# Patient Record
Sex: Female | Born: 1965 | Race: White | Hispanic: No | Marital: Single
Health system: Southern US, Community
[De-identification: ages and names within clinical notes are randomized; demographics above are authoritative.]

---

## 2006-10-28 ENCOUNTER — Ambulatory Visit: Payer: Self-pay | Admitting: Family Medicine

## 2006-12-16 ENCOUNTER — Ambulatory Visit: Payer: Self-pay | Admitting: Family Medicine

## 2007-12-22 ENCOUNTER — Other Ambulatory Visit: Admission: RE | Admit: 2007-12-22 | Discharge: 2007-12-22 | Payer: Self-pay | Admitting: Family Medicine

## 2008-01-13 ENCOUNTER — Encounter: Admission: RE | Admit: 2008-01-13 | Discharge: 2008-01-13 | Payer: Self-pay | Admitting: Family Medicine

## 2008-11-03 ENCOUNTER — Emergency Department (HOSPITAL_COMMUNITY): Admission: EM | Admit: 2008-11-03 | Discharge: 2008-11-03 | Payer: Self-pay | Admitting: Emergency Medicine

## 2009-03-01 ENCOUNTER — Encounter: Admission: RE | Admit: 2009-03-01 | Discharge: 2009-03-01 | Payer: Self-pay | Admitting: Internal Medicine

## 2010-10-19 ENCOUNTER — Other Ambulatory Visit: Payer: Self-pay | Admitting: Internal Medicine

## 2010-10-19 DIAGNOSIS — Z1231 Encounter for screening mammogram for malignant neoplasm of breast: Secondary | ICD-10-CM

## 2010-11-01 ENCOUNTER — Ambulatory Visit
Admission: RE | Admit: 2010-11-01 | Discharge: 2010-11-01 | Disposition: A | Discharge status: Home / Self Care | Payer: BC Managed Care – PPO | Source: Ambulatory Visit | Attending: Internal Medicine | Admitting: Internal Medicine

## 2010-11-01 DIAGNOSIS — Z1231 Encounter for screening mammogram for malignant neoplasm of breast: Secondary | ICD-10-CM

## 2012-07-17 ENCOUNTER — Other Ambulatory Visit: Payer: Self-pay | Admitting: Internal Medicine

## 2012-07-17 DIAGNOSIS — Z1231 Encounter for screening mammogram for malignant neoplasm of breast: Secondary | ICD-10-CM

## 2012-07-21 ENCOUNTER — Ambulatory Visit
Admission: RE | Admit: 2012-07-21 | Discharge: 2012-07-21 | Disposition: A | Discharge status: Home / Self Care | Payer: BC Managed Care – PPO | Source: Ambulatory Visit | Attending: Internal Medicine | Admitting: Internal Medicine

## 2012-07-21 DIAGNOSIS — Z1231 Encounter for screening mammogram for malignant neoplasm of breast: Secondary | ICD-10-CM

## 2015-12-22 ENCOUNTER — Other Ambulatory Visit: Payer: Self-pay

## 2015-12-22 DIAGNOSIS — Z1231 Encounter for screening mammogram for malignant neoplasm of breast: Secondary | ICD-10-CM

## 2016-01-06 ENCOUNTER — Ambulatory Visit
Admission: RE | Admit: 2016-01-06 | Discharge: 2016-01-06 | Disposition: A | Discharge status: Home / Self Care | Payer: BC Managed Care – PPO | Source: Ambulatory Visit

## 2016-01-06 DIAGNOSIS — Z1231 Encounter for screening mammogram for malignant neoplasm of breast: Secondary | ICD-10-CM

## 2018-08-28 ENCOUNTER — Other Ambulatory Visit: Payer: Self-pay | Admitting: Internal Medicine

## 2018-08-28 DIAGNOSIS — Z1231 Encounter for screening mammogram for malignant neoplasm of breast: Secondary | ICD-10-CM

## 2018-09-25 ENCOUNTER — Ambulatory Visit
Admission: RE | Admit: 2018-09-25 | Discharge: 2018-09-25 | Disposition: A | Discharge status: Home / Self Care | Payer: BC Managed Care – PPO | Source: Ambulatory Visit | Attending: Internal Medicine | Admitting: Internal Medicine

## 2018-09-25 DIAGNOSIS — Z1231 Encounter for screening mammogram for malignant neoplasm of breast: Secondary | ICD-10-CM

## 2019-11-07 ENCOUNTER — Ambulatory Visit: Payer: BC Managed Care – PPO | Attending: Internal Medicine

## 2019-11-07 ENCOUNTER — Other Ambulatory Visit: Payer: Self-pay

## 2019-11-07 DIAGNOSIS — Z23 Encounter for immunization: Secondary | ICD-10-CM

## 2019-11-07 NOTE — Progress Notes (Signed)
   Covid-19 Vaccination Clinic  Name:  Jennifer Gould    MRN: [id number] DOB: 08/30/1965  11/07/2019  Jennifer Gould was observed post Covid-19 immunization for 15 minutes without incident. She was provided with Vaccine Information Sheet and instruction to access the V-Safe system.   Jennifer Gould was instructed to call 911 with any severe reactions post vaccine: Marland Kitchen Difficulty breathing  . Swelling of face and throat  . A fast heartbeat  . A bad rash all over body  . Dizziness and weakness   Immunizations Administered    Name Date Dose VIS Date Route   Pfizer COVID-19 Vaccine 11/07/2019 12:10 PM 0.3 mL 08/07/2019 Intramuscular   Manufacturer: [employer], [employer]   Lot: [id number]   NDC: [address]-3

## 2019-12-02 ENCOUNTER — Ambulatory Visit: Payer: BC Managed Care – PPO | Attending: Internal Medicine

## 2019-12-02 DIAGNOSIS — Z23 Encounter for immunization: Secondary | ICD-10-CM

## 2019-12-02 NOTE — Progress Notes (Signed)
   Covid-19 Vaccination Clinic  Name:  Jennifer Gould    MRN: [id number] DOB: 25-Jul-1966  12/02/2019  Jennifer Gould was observed post Covid-19 immunization for 15 minutes without incident. She was provided with Vaccine Information Sheet and instruction to access the V-Safe system.   Jennifer Gould was instructed to call 911 with any severe reactions post vaccine: Marland Kitchen Difficulty breathing  . Swelling of face and throat  . A fast heartbeat  . A bad rash all over body  . Dizziness and weakness   Immunizations Administered    Name Date Dose VIS Date Route   Pfizer COVID-19 Vaccine 12/02/2019 11:09 AM 0.3 mL 08/07/2019 Intramuscular   Manufacturer: [employer], [employer]   Lot: [id number]   NDC: [address]-0

## 2020-09-05 ENCOUNTER — Other Ambulatory Visit: Payer: Self-pay | Admitting: Internal Medicine

## 2020-09-05 DIAGNOSIS — Z1231 Encounter for screening mammogram for malignant neoplasm of breast: Secondary | ICD-10-CM

## 2020-10-14 ENCOUNTER — Other Ambulatory Visit: Payer: Self-pay

## 2020-10-14 ENCOUNTER — Ambulatory Visit
Admission: RE | Admit: 2020-10-14 | Discharge: 2020-10-14 | Disposition: A | Discharge status: Home / Self Care | Payer: BC Managed Care – PPO | Source: Ambulatory Visit | Attending: Internal Medicine | Admitting: Internal Medicine

## 2020-10-14 DIAGNOSIS — Z1231 Encounter for screening mammogram for malignant neoplasm of breast: Secondary | ICD-10-CM

## 2021-10-18 IMAGING — MG MM DIGITAL SCREENING BILAT W/ TOMO AND CAD
8 series · 8 of 24 positions shown · non-contrast
Comparison: Previous exam(s).

CLINICAL DATA: Screening.

EXAM:
DIGITAL SCREENING BILATERAL MAMMOGRAM WITH TOMOSYNTHESIS AND CAD
TECHNIQUE: Bilateral screening digital craniocaudal and mediolateral oblique
mammograms were obtained. Bilateral screening digital breast
tomosynthesis was performed. The images were evaluated with
computer-aided detection.

[R MLO synth-2D]
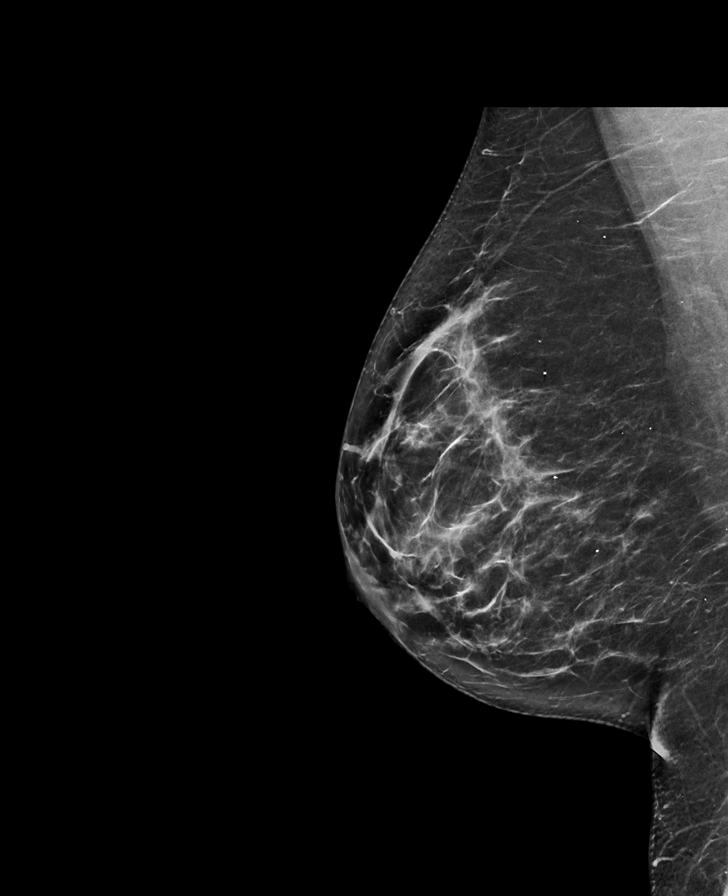

[R CC synth-2D]
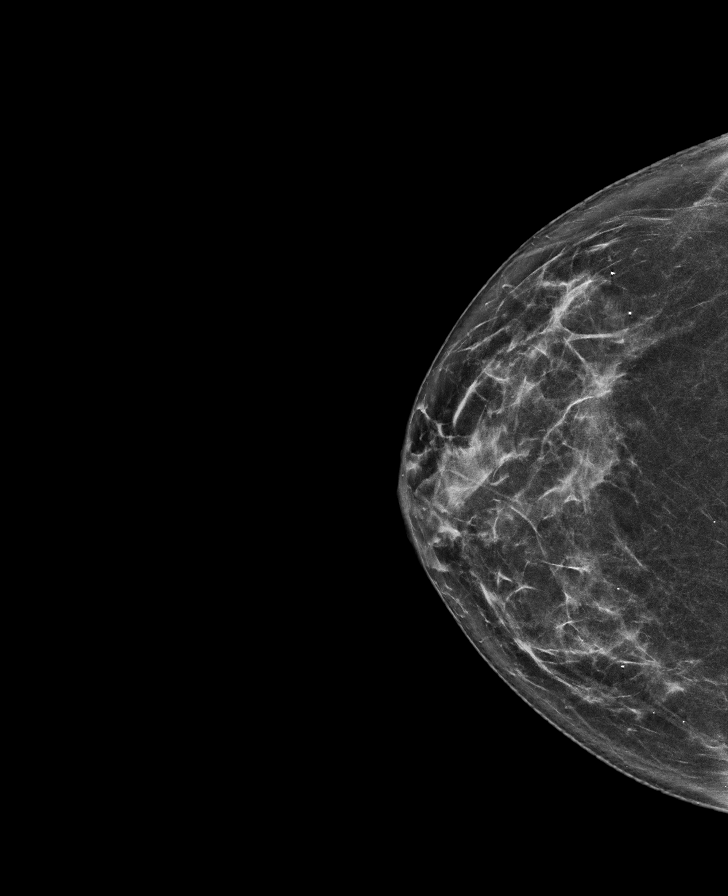

[L CC synth-2D]
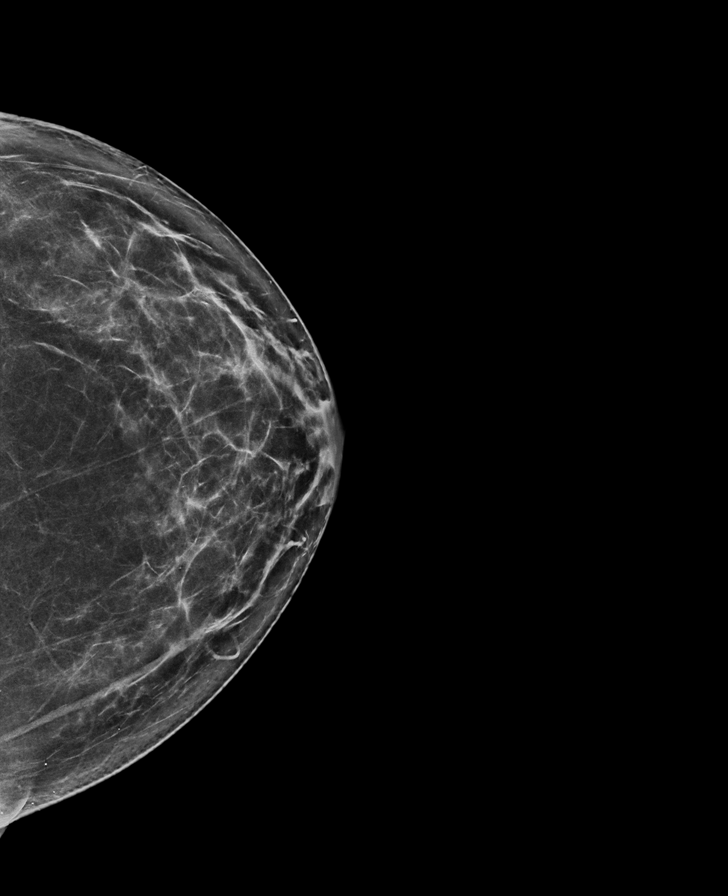

[L MLO synth-2D]
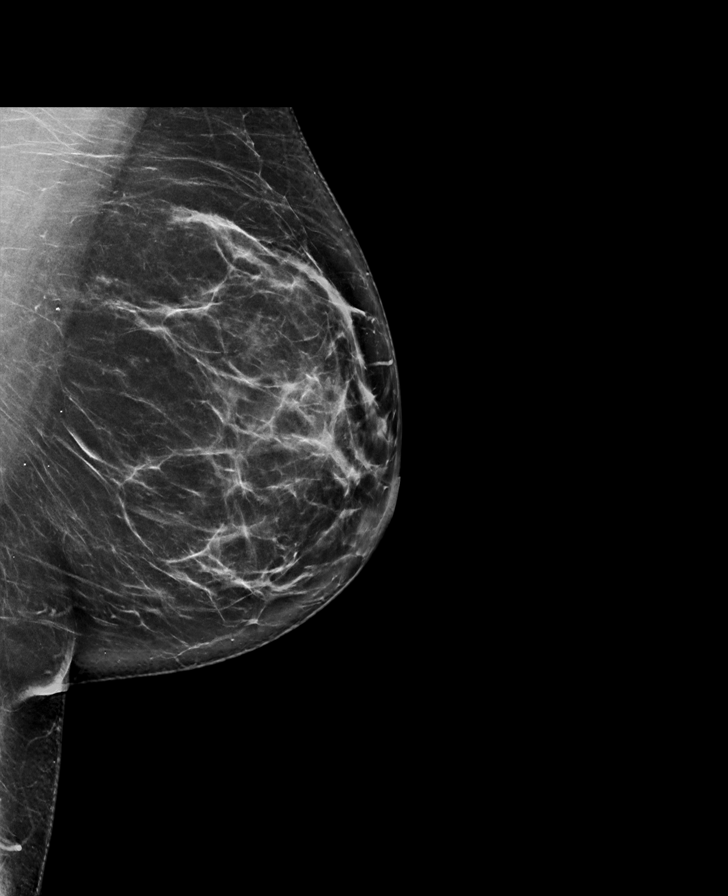

[R MLO tomo · tomo slice 38/75.0]
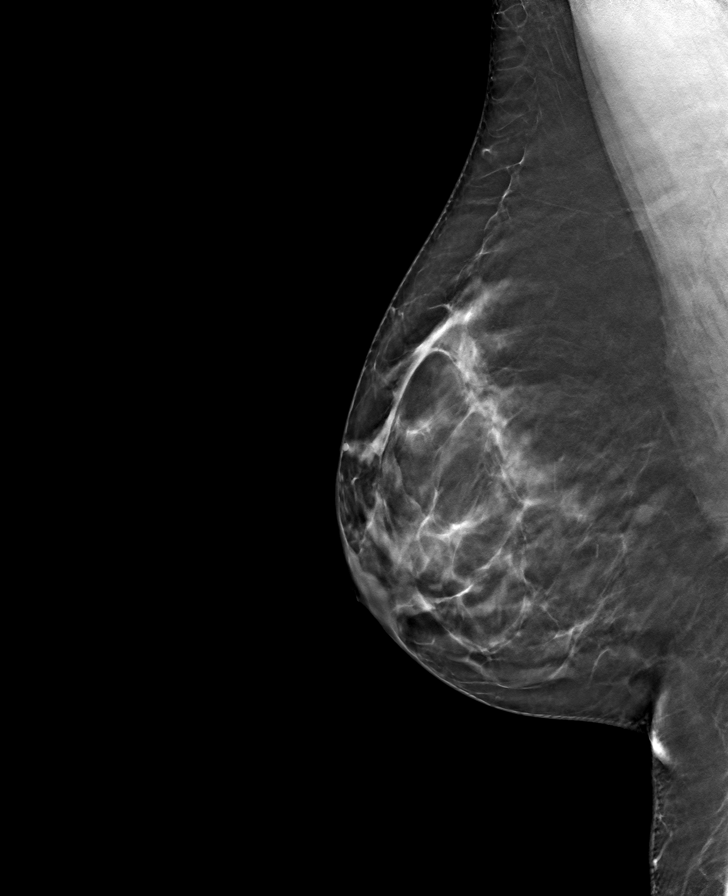

[L MLO tomo · tomo slice 43/84.0]
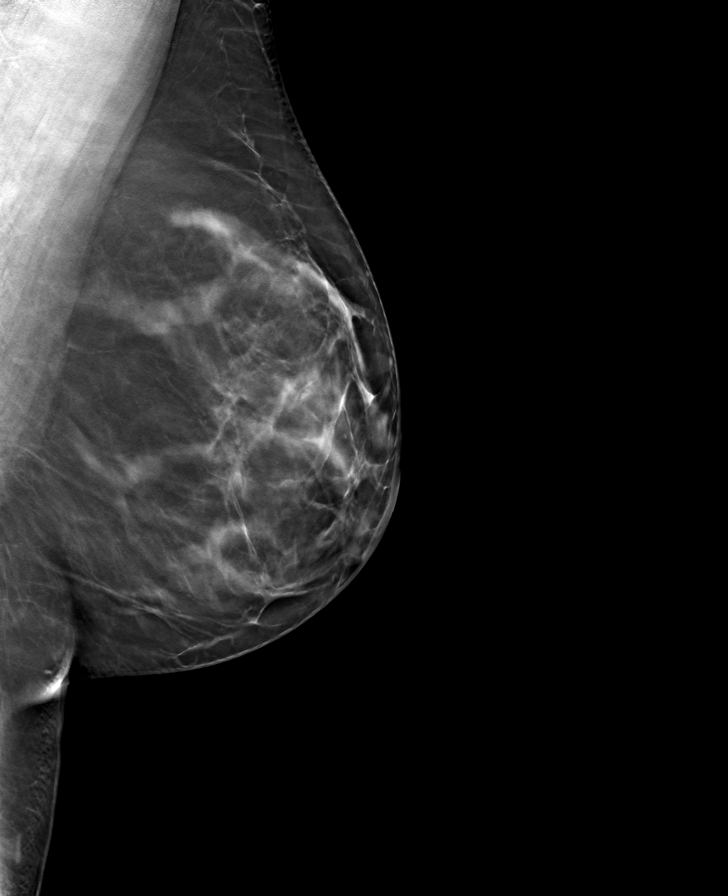

[L CC tomo · tomo slice 39/78.0]
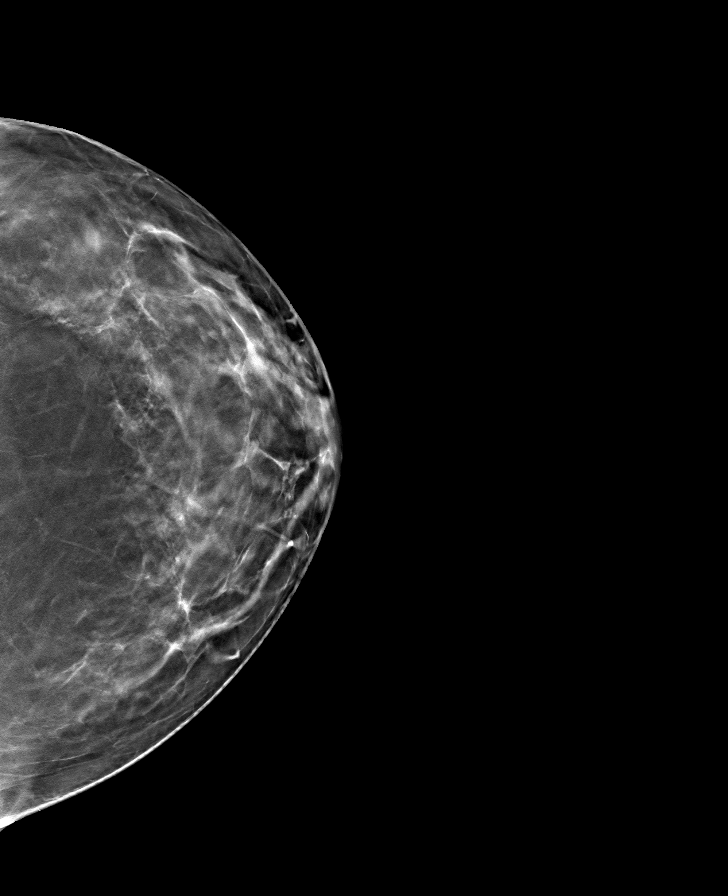

[R CC tomo · tomo slice 36/71.0]
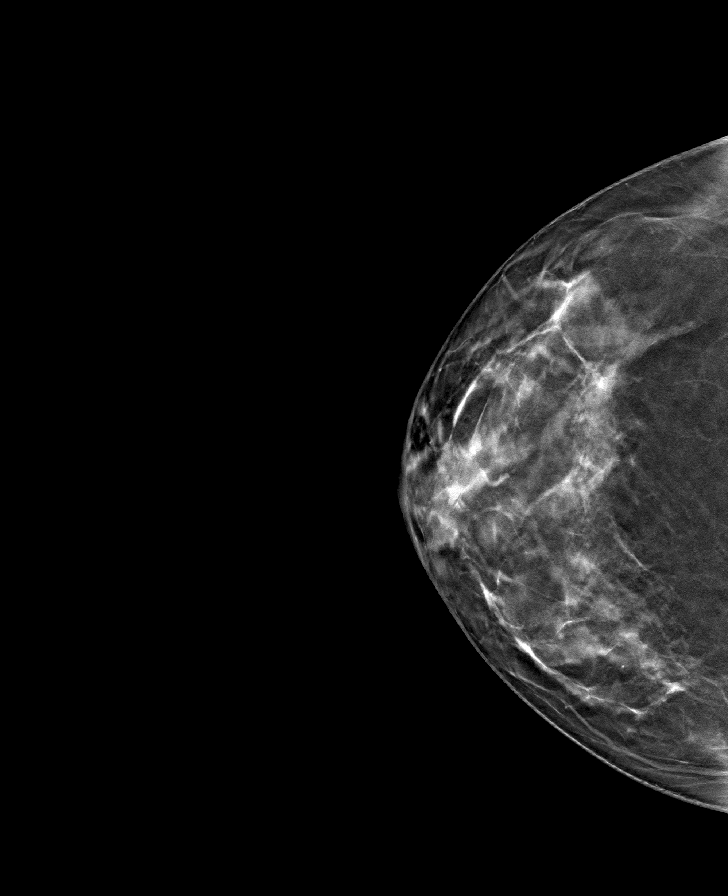

[8 of 24 positions shown; findings below may reference images not displayed]

ACR Breast Density Category b: There are scattered areas of
fibroglandular density.
FINDINGS: There are no findings suspicious for malignancy.
IMPRESSION: No mammographic evidence of malignancy. A result letter of this
screening mammogram will be mailed directly to the patient.

RECOMMENDATION:
Screening mammogram in one year. (Code:51-O-LD2)

BI-RADS CATEGORY  1: Negative.
